# Patient Record
Sex: Male | Born: 1985 | Race: Black or African American | Hispanic: No | Marital: Single | Smoking: Current every day smoker
Health system: Southern US, Community
[De-identification: ages and names within clinical notes are randomized; demographics above are authoritative.]

---

## 2007-05-02 ENCOUNTER — Emergency Department: Payer: Self-pay | Admitting: Emergency Medicine

## 2008-09-11 IMAGING — CR DG FOOT COMPLETE 3+V*L*
1 series · 3 of 3 positions shown · non-contrast
Comparison: none

REASON FOR EXAM: pain and swelling
COMMENTS:   LMP: (Male)

PROCEDURE:     DXR - DXR FOOT LT COMP W/OBLIQUES  - May 02, 2007  [DATE]
RESULT:     No fracture, dislocation or other acute bony abnormality is
identified.

[Series 1: view not recorded · 0.17mm/px · 3 of 3 slices shown]
[im 1/3]
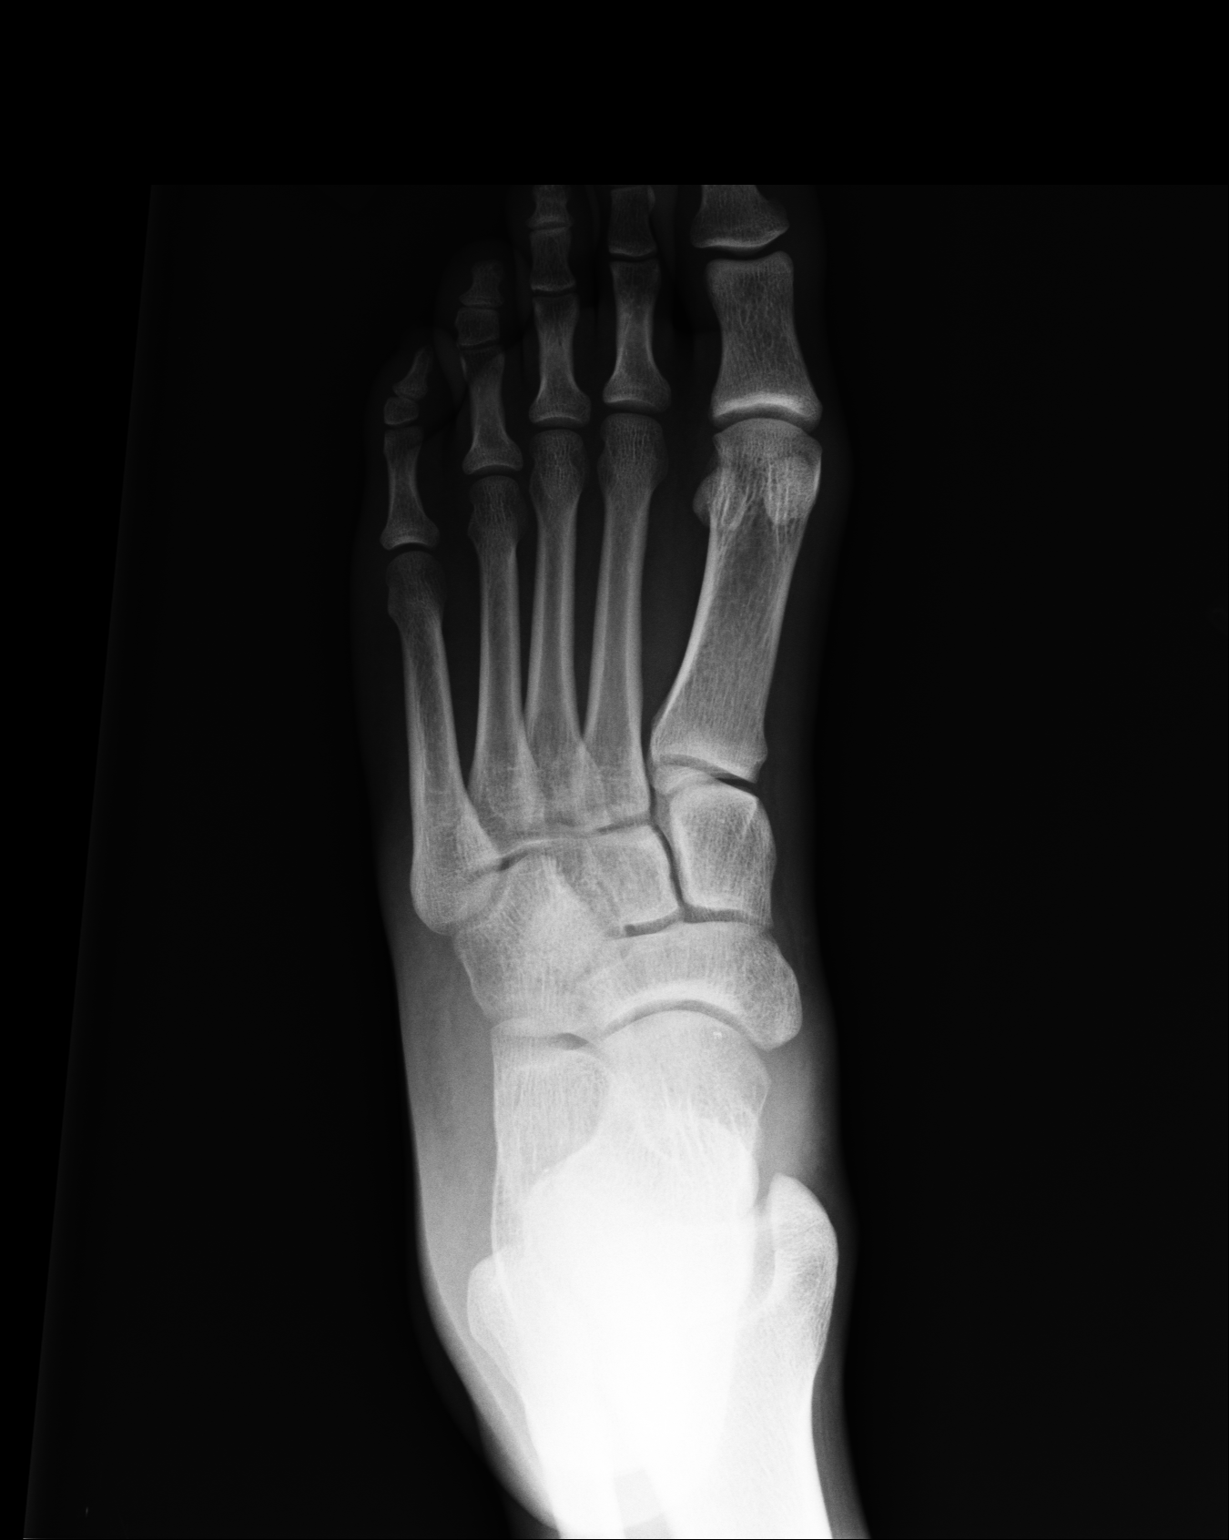
[im 2/3]
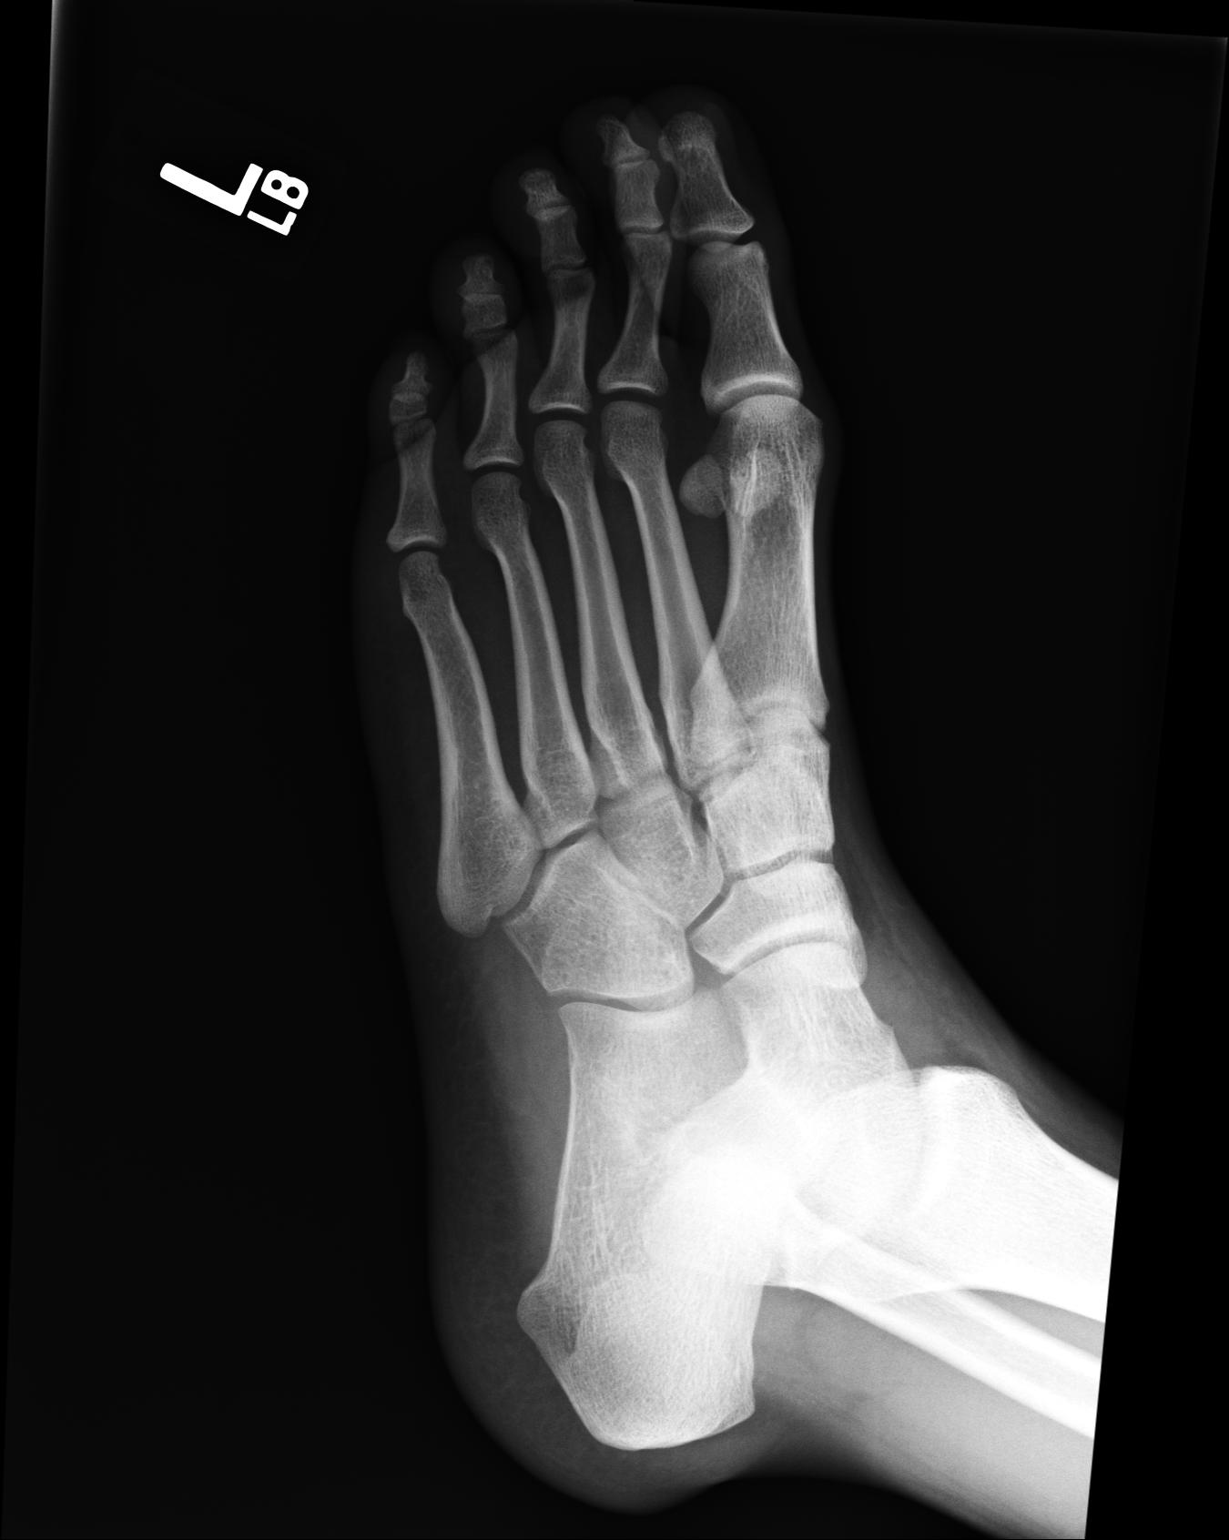
[im 3/3]
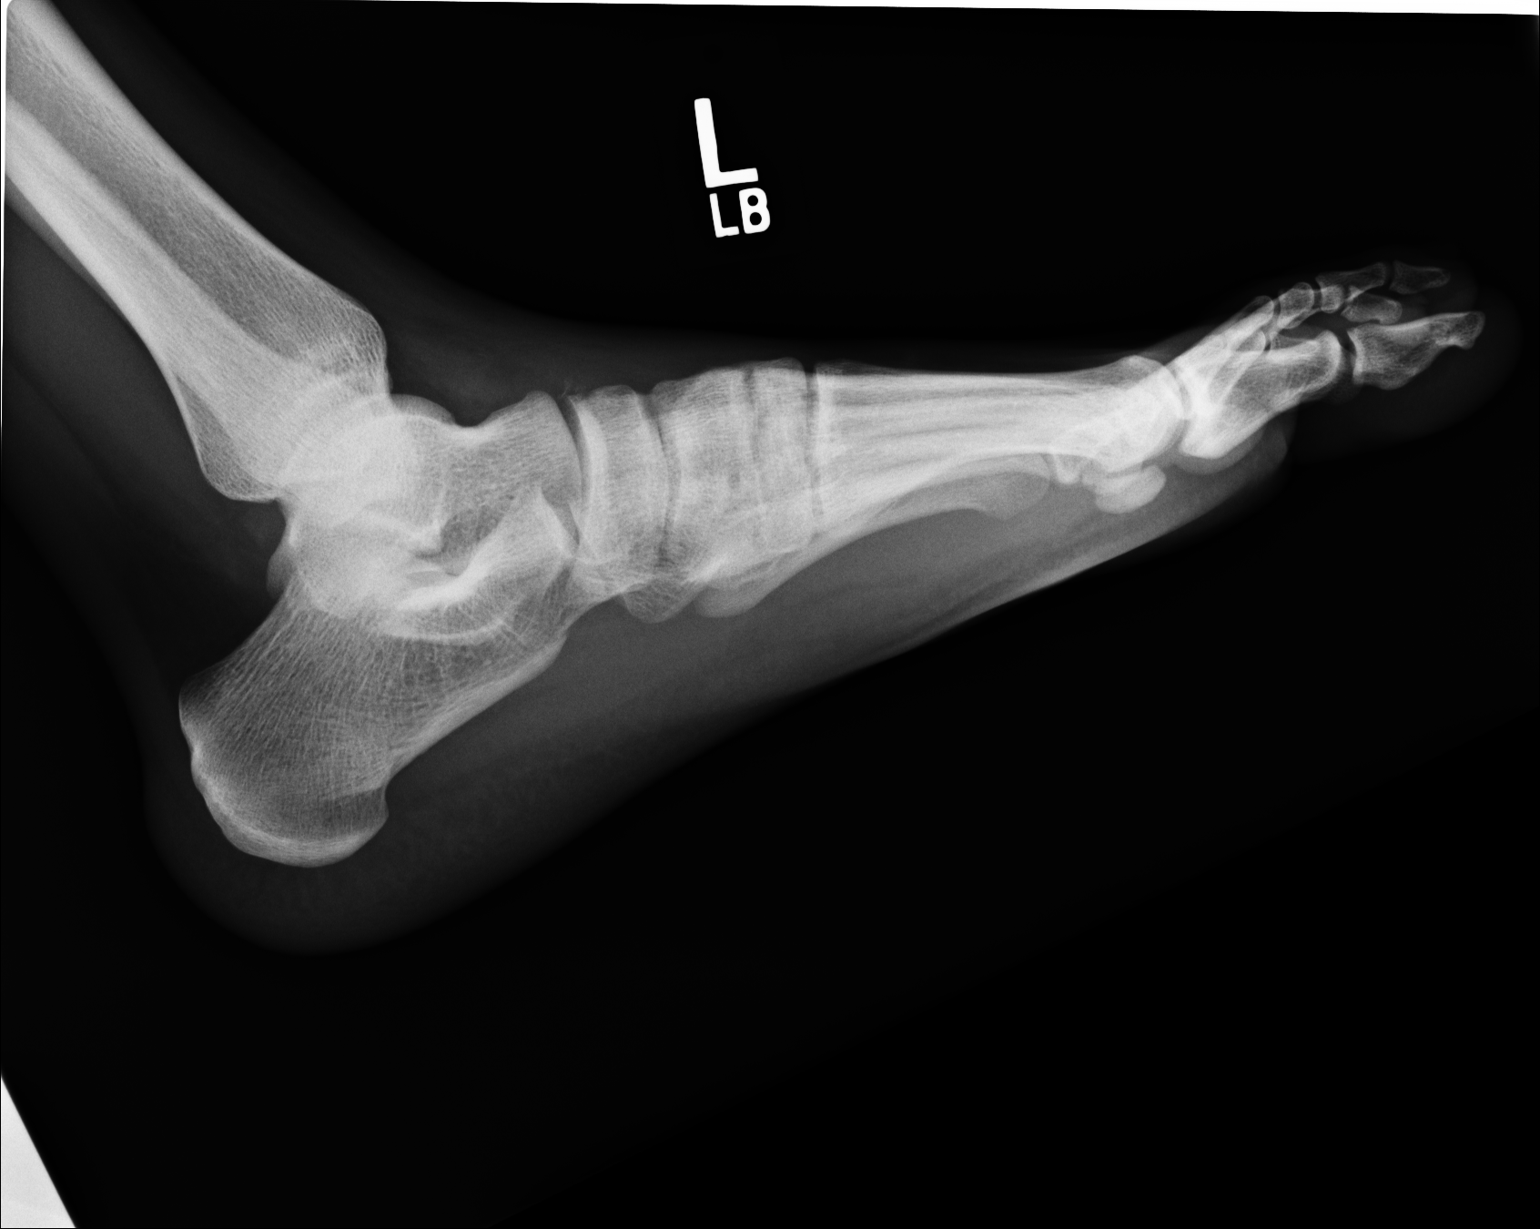

[3 of 3 positions shown; findings below may reference images not displayed]

IMPRESSION: 1.     No acute changes are identified.

## 2008-10-22 ENCOUNTER — Emergency Department: Payer: Self-pay | Admitting: Emergency Medicine

## 2011-04-05 ENCOUNTER — Emergency Department: Payer: Self-pay | Admitting: *Deleted

## 2016-07-08 ENCOUNTER — Emergency Department
Admission: EM | Admit: 2016-07-08 | Discharge: 2016-07-08 | Disposition: A | Discharge status: Home / Self Care | Payer: Self-pay | Attending: Emergency Medicine | Admitting: Emergency Medicine

## 2016-07-08 DIAGNOSIS — M25511 Pain in right shoulder: Secondary | ICD-10-CM | POA: Insufficient documentation

## 2016-07-08 DIAGNOSIS — M436 Torticollis: Secondary | ICD-10-CM | POA: Insufficient documentation

## 2016-07-08 MED ORDER — CYCLOBENZAPRINE HCL 10 MG PO TABS: 10.0000 mg | ORAL_TABLET | Freq: Three times a day (TID) | ORAL | 0 refills | Status: AC | PRN

## 2016-07-08 MED ORDER — ORPHENADRINE CITRATE 30 MG/ML IJ SOLN
60.0000 mg | Freq: Two times a day (BID) | INTRAMUSCULAR | Status: DC
  Administered 2016-07-08: 60 mg via INTRAMUSCULAR
  Filled 2016-07-08: qty 2

## 2016-07-08 MED ORDER — KETOROLAC TROMETHAMINE 60 MG/2ML IM SOLN
60.0000 mg | Freq: Once | INTRAMUSCULAR | Status: AC
  Administered 2016-07-08: 60 mg via INTRAMUSCULAR
  Filled 2016-07-08: qty 2

## 2016-07-08 MED ORDER — TRAMADOL HCL 50 MG PO TABS
50.0000 mg | ORAL_TABLET | Freq: Once | ORAL | Status: AC
  Administered 2016-07-08: 50 mg via ORAL
  Filled 2016-07-08: qty 1

## 2016-07-08 MED ORDER — IBUPROFEN 600 MG PO TABS: 600.0000 mg | ORAL_TABLET | Freq: Three times a day (TID) | ORAL | 0 refills | Status: AC | PRN

## 2016-07-08 MED ORDER — TRAMADOL HCL 50 MG PO TABS: 50.0000 mg | ORAL_TABLET | Freq: Four times a day (QID) | ORAL | 0 refills | Status: AC | PRN

## 2016-07-08 NOTE — ED Provider Notes (Signed)
[facility] Emergency Department Provider Note   ____________________________________________   First MD Initiated Contact with Patient 07/08/16 [phone]     (approximate)  I have reviewed the triage vital signs and the nursing notes.   HISTORY  Chief Complaint Back Pain and Shoulder Pain    HPI Bess HarvestRay D Frankum is a 31 y.o. male patient complaining of waking up with neck pain on the right side. Incident occurred 4 days ago. Patient state pain increases with any movement of the neck. Patient stated pain is radiating to the superior aspect of right shoulder. Patient denies numbness to the right upper extremity. Patient rates the pain as 8/10. Patient stated no relief with over-the-counter anti-inflammatory medications.  No past medical history on file.  There are no active problems to display for this patient.   No past surgical history on file.  Prior to Admission medications   Medication Sig Start Date End Date Taking? Authorizing Provider  cyclobenzaprine (FLEXERIL) 10 MG tablet Take 1 tablet (10 mg total) by mouth 3 (three) times daily as needed. 07/08/16   Joni ReiningSmith, Lakyn Mantione K, PA-C  ibuprofen (ADVIL,MOTRIN) 600 MG tablet Take 1 tablet (600 mg total) by mouth every 8 (eight) hours as needed. 07/08/16   Joni ReiningSmith, Nevea Spiewak K, PA-C  traMADol (ULTRAM) 50 MG tablet Take 1 tablet (50 mg total) by mouth every 6 (six) hours as needed for moderate pain. 07/08/16   Joni ReiningSmith, Shonita Rinck K, PA-C    Allergies Patient has no known allergies.  No family history on file.  Social History Social History  Substance Use Topics  . Smoking status: Not on file  . Smokeless tobacco: Not on file  . Alcohol use Not on file    Review of Systems  Constitutional: No fever/chills Eyes: No visual changes. ENT: No sore throat. Cardiovascular: Denies chest pain. Respiratory: Denies shortness of breath. Gastrointestinal: No abdominal pain.  No nausea, no vomiting.  No diarrhea.  No  constipation. Genitourinary: Negative for dysuria. Musculoskeletal: Right lateral neck pain Skin: Negative for rash. Neurological: Negative for headaches, focal weakness or numbness.   ____________________________________________   PHYSICAL EXAM:  VITAL SIGNS: ED Triage Vitals  Enc Vitals Group     BP 07/08/16 0814 136/81     Pulse Rate 07/08/16 0814 (!) 58     Resp 07/08/16 0814 16     Temp 07/08/16 0814 98.1 F (36.7 C)     Temp Source 07/08/16 0814 Oral     SpO2 07/08/16 0814 99 %     Weight 07/08/16 0812 160 lb (72.6 kg)     Height 07/08/16 0812 5\' 9"  (1.753 m)     Head Circumference --      Peak Flow --      Pain Score 07/08/16 0811 8     Pain Loc --      Pain Edu? --      Excl. in GC? --     Constitutional: Alert and oriented. Well appearing and in no acute distress. Eyes: Conjunctivae are normal. PERRL. EOMI. Head: Atraumatic. Nose: No congestion/rhinnorhea. Mouth/Throat: Mucous membranes are moist.  Oropharynx non-erythematous. Neck: No stridor.  No cervical spine tenderness to palpation. Decreased range of motion with left lateral movements and flexion. Hematological/Lymphatic/Immunilogical: No cervical lymphadenopathy. Cardiovascular: Normal rate, regular rhythm. Grossly normal heart sounds.  Good peripheral circulation. Respiratory: Normal respiratory effort.  No retractions. Lungs CTAB. Gastrointestinal: Soft and nontender. No distention. No abdominal bruits. No CVA tenderness. Musculoskeletal: No lower extremity tenderness nor edema.  No joint effusions. Neurologic:  Normal speech and language. No gross focal neurologic deficits are appreciated. No gait instability. Skin:  Skin is warm, dry and intact. No rash noted. Psychiatric: Mood and affect are normal. Speech and behavior are normal.  ____________________________________________   LABS (all labs ordered are listed, but only abnormal results are displayed)  Labs Reviewed - No data to  display ____________________________________________  EKG   ____________________________________________  RADIOLOGY   ____________________________________________   PROCEDURES  Procedure(s) performed: None  Procedures  Critical Care performed: No  ____________________________________________   INITIAL IMPRESSION / ASSESSMENT AND PLAN / ED COURSE  Pertinent labs & imaging results that were available during my care of the patient were reviewed by me and considered in my medical decision making (see chart for details).  Torticollis. Patient given discharge care instructions. Patient advised follow-up with the "clinic if condition persists.      ____________________________________________   FINAL CLINICAL IMPRESSION(S) / ED DIAGNOSES  Final diagnoses:  Torticollis, acute      NEW MEDICATIONS STARTED DURING THIS VISIT:  New Prescriptions   CYCLOBENZAPRINE (FLEXERIL) 10 MG TABLET    Take 1 tablet (10 mg total) by mouth 3 (three) times daily as needed.   IBUPROFEN (ADVIL,MOTRIN) 600 MG TABLET    Take 1 tablet (600 mg total) by mouth every 8 (eight) hours as needed.   TRAMADOL (ULTRAM) 50 MG TABLET    Take 1 tablet (50 mg total) by mouth every 6 (six) hours as needed for moderate pain.     Note:  This document was prepared using Dragon voice recognition software and may include unintentional dictation errors.    Joni Reining, PA-C 07/08/16 Matthias Hughs    Governor Rooks, MD 07/08/16 1300

## 2016-07-08 NOTE — ED Triage Notes (Signed)
C/O right shoulder and upper back pain x 4 days.

## 2020-04-07 ENCOUNTER — Ambulatory Visit: Payer: Self-pay

## 2020-04-13 ENCOUNTER — Ambulatory Visit: Payer: Self-pay

## 2020-04-16 ENCOUNTER — Ambulatory Visit: Payer: Self-pay | Admitting: Physician Assistant

## 2020-04-16 ENCOUNTER — Other Ambulatory Visit: Payer: Self-pay

## 2020-04-16 DIAGNOSIS — Z202 Contact with and (suspected) exposure to infections with a predominantly sexual mode of transmission: Secondary | ICD-10-CM

## 2020-04-16 DIAGNOSIS — Z113 Encounter for screening for infections with a predominantly sexual mode of transmission: Secondary | ICD-10-CM

## 2020-04-16 DIAGNOSIS — A539 Syphilis, unspecified: Secondary | ICD-10-CM

## 2020-04-16 MED ORDER — PENICILLIN G BENZATHINE 1200000 UNIT/2ML IM SUSP
2.4000 10*6.[IU] | Freq: Once | INTRAMUSCULAR | Status: AC
  Administered 2020-04-16: 2.4 10*6.[IU] via INTRAMUSCULAR

## 2020-04-17 ENCOUNTER — Encounter: Payer: Self-pay | Admitting: Physician Assistant

## 2020-04-17 DIAGNOSIS — A539 Syphilis, unspecified: Secondary | ICD-10-CM | POA: Insufficient documentation

## 2020-04-17 NOTE — Progress Notes (Signed)
   [facility] Department STI clinic/screening visit  Subjective:  Adrian Zamora is a 35 y.o. male being seen today for an STI screening visit. The patient reports they do not have symptoms.    Patient has the following medical conditions:  There are no problems to display for this patient.    Chief Complaint  Patient presents with  . SEXUALLY TRANSMITTED DISEASE    screening    HPI  Patient reports that he is a contact to Syphilis and would like treatment.  Reports that he had some "open sores" in his genital area and a "white circle" on his tongue about 4 months ago but all of these things have resolved. Patient states that he was treated for Syphilis in 2016 and 2017. Denies current symptoms, chronic conditions, surgeries and regular medicines.  States last HIV test was in 2017.     See flowsheet for further details and programmatic requirements.    The following portions of the patient's history were reviewed and updated as appropriate: allergies, current medications, past medical history, past social history, past surgical history and problem list.  Objective:  There were no vitals filed for this visit.  Physical Exam Constitutional:      General: He is not in acute distress.    Appearance: Normal appearance. He is normal weight.  HENT:     Head: Normocephalic and atraumatic.     Comments: No nits,lice, or hair loss. No cervical, supraclavicular or axillary adenopathy.    Mouth/Throat:     Mouth: Mucous membranes are moist.     Pharynx: Oropharynx is clear. No oropharyngeal exudate or posterior oropharyngeal erythema.  Eyes:     Conjunctiva/sclera: Conjunctivae normal.  Pulmonary:     Effort: Pulmonary effort is normal.  Skin:    General: Skin is warm and dry.     Findings: No bruising, erythema, lesion or rash.  Neurological:     Mental Status: He is alert and oriented to person, place, and time.  Psychiatric:        Mood and Affect: Mood normal.         Behavior: Behavior normal.        Thought Content: Thought content normal.        Judgment: Judgment normal.       Assessment and Plan:  Adrian Zamora is a 35 y.o. male presenting to the [facility] Department for STI screening  1. Screening for STD (sexually transmitted disease) Patient into clinic without symptoms. Patient declines HIV, Hep B and C blood work today as well as screening for GC and NGU. Rec condoms with all sex. Await test results.  Counseled that RN will call if needs to RTC for treatment once results are back. - Syphilis Serology, [facility]  2. Syphilis contact Will treat as a contact to Syphilis with Bicillin 2.4 mu IM today. No sex for 14 days and until after partner completes treatment.  Reviewed with patient normal SE after injections and recommend that patient use OTC analgesics and warm compresses as needed for soreness. Call with questions or concerns. - penicillin g benzathine (BICILLIN LA) 1200000 UNIT/2ML injection 2.4 Million Units     No follow-ups on file.  No future appointments.  Matt Holmes, PA

## 2020-04-23 ENCOUNTER — Telehealth: Payer: Self-pay

## 2020-05-06 NOTE — Telephone Encounter (Signed)
DIS aware of results and will attempt to contact patient also Richmond Campbell, RN
